# Patient Record
Sex: Female | Born: 1965 | Race: White | Hispanic: No | Marital: Married | Smoking: Former smoker
Health system: Southern US, Community
[De-identification: ages and names within clinical notes are randomized; demographics above are authoritative.]

## PROBLEM LIST (undated history)

## (undated) DIAGNOSIS — F988 Other specified behavioral and emotional disorders with onset usually occurring in childhood and adolescence: Secondary | ICD-10-CM

## (undated) DIAGNOSIS — F32A Depression, unspecified: Secondary | ICD-10-CM

## (undated) DIAGNOSIS — G43909 Migraine, unspecified, not intractable, without status migrainosus: Secondary | ICD-10-CM

## (undated) DIAGNOSIS — F329 Major depressive disorder, single episode, unspecified: Secondary | ICD-10-CM

## (undated) DIAGNOSIS — J45909 Unspecified asthma, uncomplicated: Secondary | ICD-10-CM

## (undated) DIAGNOSIS — K219 Gastro-esophageal reflux disease without esophagitis: Secondary | ICD-10-CM

## (undated) HISTORY — PX: SHOULDER SURGERY: SHX246

## (undated) HISTORY — PX: CHOLECYSTECTOMY: SHX55

## (undated) HISTORY — PX: TONSILLECTOMY: SUR1361

---

## 2011-03-23 ENCOUNTER — Ambulatory Visit: Payer: Self-pay

## 2012-03-29 ENCOUNTER — Ambulatory Visit: Payer: Self-pay | Admitting: Emergency Medicine

## 2012-03-29 LAB — RAPID STREP-A WITH REFLX: Micro Text Report: NEGATIVE

## 2012-03-29 LAB — RAPID INFLUENZA A&B ANTIGENS

## 2012-03-31 LAB — BETA STREP CULTURE(ARMC)

## 2015-07-13 ENCOUNTER — Ambulatory Visit
Admission: EM | Admit: 2015-07-13 | Discharge: 2015-07-13 | Disposition: A | Discharge status: Home / Self Care | Payer: Federal, State, Local not specified - PPO | Attending: Family Medicine | Admitting: Family Medicine

## 2015-07-13 DIAGNOSIS — R0981 Nasal congestion: Secondary | ICD-10-CM

## 2015-07-13 DIAGNOSIS — J069 Acute upper respiratory infection, unspecified: Secondary | ICD-10-CM

## 2015-07-13 HISTORY — DX: Gastro-esophageal reflux disease without esophagitis: K21.9

## 2015-07-13 HISTORY — DX: Depression, unspecified: F32.A

## 2015-07-13 HISTORY — DX: Other specified behavioral and emotional disorders with onset usually occurring in childhood and adolescence: F98.8

## 2015-07-13 HISTORY — DX: Migraine, unspecified, not intractable, without status migrainosus: G43.909

## 2015-07-13 HISTORY — DX: Major depressive disorder, single episode, unspecified: F32.9

## 2015-07-13 MED ORDER — AZITHROMYCIN 250 MG PO TABS: ORAL_TABLET | ORAL | Status: DC

## 2015-07-13 NOTE — ED Notes (Signed)
States has had sinus congestion x 2 weeks, and yesterday started with non productive cough. Denies fever

## 2015-07-13 NOTE — ED Provider Notes (Signed)
Patient presents today with symptoms of nasal congestion for process like 2 weeks, and mild productive cough for about a week. Patient denies any fever, chest pain, shortness of breath, severe headache, nausea, vomiting, diarrhea. Patient has tried some over-the-counter medications without any significant relief. She does have a history of asthma. She is a former smoker. She has not taken any fever lowering medication today.  ROS: Negative except mentioned above.  Vitals as per Epic GENERAL: NAD HEENT: mild pharyngeal erythema, no exudate, no erythema of TMs, no significant maxillary sinus tenderness, no cervical LAD RESP: CTA B CARD: RRR  A/P: URI, Sinusitis- Z-pk, Delysm prn, Claritin prn, Tylenol/Motrin prn, Albuterol Inhaler when necessary, rest, hydration, seek medical attention if symptoms persist or worsen as discussed.    Jolene ProvostKirtida Ayelet Gruenewald, MD 07/13/15 [phone]

## 2015-09-06 ENCOUNTER — Encounter: Payer: Self-pay | Admitting: Emergency Medicine

## 2015-09-06 ENCOUNTER — Emergency Department
Admission: EM | Admit: 2015-09-06 | Discharge: 2015-09-06 | Disposition: A | Discharge status: Home / Self Care | Payer: Worker's Compensation | Attending: Emergency Medicine | Admitting: Emergency Medicine

## 2015-09-06 ENCOUNTER — Emergency Department: Payer: Worker's Compensation

## 2015-09-06 DIAGNOSIS — Z79899 Other long term (current) drug therapy: Secondary | ICD-10-CM | POA: Diagnosis not present

## 2015-09-06 DIAGNOSIS — Z880 Allergy status to penicillin: Secondary | ICD-10-CM | POA: Insufficient documentation

## 2015-09-06 DIAGNOSIS — J45909 Unspecified asthma, uncomplicated: Secondary | ICD-10-CM | POA: Diagnosis present

## 2015-09-06 DIAGNOSIS — J45901 Unspecified asthma with (acute) exacerbation: Secondary | ICD-10-CM | POA: Diagnosis not present

## 2015-09-06 DIAGNOSIS — Z87891 Personal history of nicotine dependence: Secondary | ICD-10-CM | POA: Insufficient documentation

## 2015-09-06 HISTORY — DX: Unspecified asthma, uncomplicated: J45.909

## 2015-09-06 MED ORDER — PREDNISONE 10 MG PO TABS: ORAL_TABLET | ORAL | Status: DC

## 2015-09-06 MED ORDER — PREDNISONE 20 MG PO TABS
30.0000 mg | ORAL_TABLET | Freq: Once | ORAL | Status: AC
  Administered 2015-09-06: 30 mg via ORAL
  Filled 2015-09-06: qty 1

## 2015-09-06 NOTE — Discharge Instructions (Signed)
Asthma, Adult Asthma is a condition of the lungs in which the airways tighten and narrow. Asthma can make it hard to breathe. Asthma cannot be cured, but medicine and lifestyle changes can help control it. Asthma may be started (triggered) by:  Animal skin flakes (dander).  Dust.  Cockroaches.  Pollen.  Mold.  Smoke.  Cleaning products.  Hair sprays or aerosol sprays.  Paint fumes or strong smells.  Cold air, weather changes, and winds.  Crying or laughing hard.  Stress.  Certain medicines or drugs.  Foods, such as dried fruit, potato chips, and sparkling grape juice.  Infections or conditions (colds, flu).  Exercise.  Certain medical conditions or diseases.  Exercise or tiring activities. HOME CARE   Take medicine as told by your doctor.  Use a peak flow meter as told by your doctor. A peak flow meter is a tool that measures how well the lungs are working.  Record and keep track of the peak flow meter's readings.  Understand and use the asthma action plan. An asthma action plan is a written plan for taking care of your asthma and treating your attacks.  To help prevent asthma attacks:  Do not smoke. Stay away from secondhand smoke.  Change your heating and air conditioning filter often.  Limit your use of fireplaces and wood stoves.  Get rid of pests (such as roaches and mice) and their droppings.  Throw away plants if you see mold on them.  Clean your floors. Dust regularly. Use cleaning products that do not smell.  Have someone vacuum when you are not home. Use a vacuum cleaner with a HEPA filter if possible.  Replace carpet with wood, tile, or vinyl flooring. Carpet can trap animal skin flakes and dust.  Use allergy-proof pillows, mattress covers, and box spring covers.  Wash bed sheets and blankets every week in hot water and dry them in a dryer.  Use blankets that are made of polyester or cotton.  Clean bathrooms and kitchens with bleach.  If possible, have someone repaint the walls in these rooms with mold-resistant paint. Keep out of the rooms that are being cleaned and painted.  Wash hands often. GET HELP IF:  You have make a whistling sound when breaking (wheeze), have shortness of breath, or have a cough even if taking medicine to prevent attacks.  The colored mucus you cough up (sputum) is thicker than usual.  The colored mucus you cough up changes from clear or white to yellow, green, gray, or bloody.  You have problems from the medicine you are taking such as:  A rash.  Itching.  Swelling.  Trouble breathing.  You need reliever medicines more than 2-3 times a week.  Your peak flow measurement is still at 50-79% of your personal best after following the action plan for 1 hour.  You have a fever. GET HELP RIGHT AWAY IF:   You seem to be worse and are not responding to medicine during an asthma attack.  You are short of breath even at rest.  You get short of breath when doing very little activity.  You have trouble eating, drinking, or talking.  You have chest pain.  You have a fast heartbeat.  Your lips or fingernails start to turn blue.  You are light-headed, dizzy, or faint.  Your peak flow is less than 50% of your personal best.   This information is not intended to replace advice given to you by your health care provider. Make sure  you discuss any questions you have with your health care provider.   Document Released: 12/24/2007 Document Revised: 03/28/2015 Document Reviewed: 02/03/2013 Elsevier Interactive Patient Education [employer]    Follow-up with your primary care doctor. Continue using your  inhaler as needed. Also take prednisone daily. You have had your first dose in the emergency room and will not need to take anymore until tomorrow. You  may return to work tomorrow.

## 2015-09-06 NOTE — ED Notes (Signed)
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 Pt in NAD at this time.

## 2015-09-06 NOTE — ED Provider Notes (Signed)
[facility] Emergency Department Provider Note  ____________________________________________  Time seen: Approximately 8:39 AM  I have reviewed the triage vital signs and the nursing notes.   HISTORY  Chief Complaint Asthma   HPI Brittany Ferrell is a 50 y.o. 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 Patient has a history of asthma and is very sensitive to sense. She states that she screamed for someone to admit to spraying the perfume which no one did.She states at that time she did not immediately use her inhaler which she has with her but continued to drive to school and then after letting children off the bus she began using her inhaler. She states she used it once. She is now in the emergency room for this event. Rates her discomfort is an 8 out of 10.   Past Medical History  Diagnosis Date  . ADD (attention deficit disorder)   . Depressed   . Migraines   . GERD (gastroesophageal reflux disease)   . Asthma     There are no active problems to display for this patient.   Past Surgical History  Procedure Laterality Date  . Cholecystectomy    . Shoulder surgery    . Tonsillectomy    . Cesarean section      1998    Current Outpatient Rx  Name  Route  Sig  Dispense  Refill  . albuterol (PROVENTIL HFA;VENTOLIN HFA) 108 (90 Base) MCG/ACT inhaler   Inhalation   Inhale 2 puffs into the lungs every 6 (six) hours as needed for wheezing or shortness of breath.         Marland Kitchen buPROPion (WELLBUTRIN SR) 150 MG 12 hr tablet   Oral   Take 150 mg by mouth 2 (two) times daily.         Marland Kitchen lisdexamfetamine (VYVANSE) 20 MG capsule   Oral   Take 20 mg by mouth daily.         . pantoprazole (PROTONIX) 40 MG tablet   Oral   Take 40 mg by mouth daily.         . promethazine (PHENERGAN) 25 MG tablet   Oral   Take 25 mg by mouth every 6 (six) hours as needed for nausea or vomiting.          . topiramate (TOPAMAX) 50 MG tablet   Oral   Take 50 mg by mouth 2 (two) times daily.         . valACYclovir (VALTREX) 500 MG tablet   Oral   Take 500 mg by mouth 2 (two) times daily.         Marland Kitchen azithromycin (ZITHROMAX Z-PAK) 250 MG tablet      Use as directed for 5 days.   6 each   0   . Meclizine HCl (ANTIVERT PO)   Oral   Take by mouth.         . predniSONE (DELTASONE) 10 MG tablet      Take 3 tablets once a day for 2 days starting Friday   9 tablet   0   . ZOLMitriptan (ZOMIG) 2.5 MG tablet   Oral   Take 2.5 mg by mouth once. May repeat in 2 hours if headache persists or recurs.           Allergies Coconut fragrance; Oxycodone; Penicillins; and Ciprofloxacin  Family History  Problem Relation Age of Onset  . Cancer  Mother   . Heart failure Father     Social History Social History  Substance Use Topics  . Smoking status: Former Games developer  . Smokeless tobacco: None  . Alcohol Use: Yes     Comment: rarely    Review of Systems Constitutional: No fever/chills Eyes: No visual changes. ENT: No sore throat. Cardiovascular: Denies chest pain. Respiratory: Positive shortness of breath, positive wheezing. Gastrointestinal: No nausea, no vomiting.  Musculoskeletal: Negative for back pain. Skin: Negative for rash. Neurological: Negative for headaches, focal weakness or numbness.  10-point ROS otherwise negative.  ____________________________________________   PHYSICAL EXAM:  VITAL SIGNS: ED Triage Vitals  Enc Vitals Group     BP 09/06/15 0801 156/93 mmHg     Pulse Rate 09/06/15 0801 81     Resp 09/06/15 0801 18     Temp 09/06/15 0801 97.9 F (36.6 C)     Temp Source 09/06/15 0801 Oral     SpO2 09/06/15 0801 100 %     Weight 09/06/15 0801 185 lb (83.915 kg)     Height 09/06/15 0801  (1.575 m)     Head Cir --      Peak Flow --      Pain Score 09/06/15 0802 8     Pain Loc --      Pain Edu? --      Excl. in GC? --      Constitutional: Alert and oriented. Well appearing and in no acute distress. Eyes: Conjunctivae are normal. PERRL. EOMI. Head: Atraumatic. Nose: No congestion/rhinnorhea.   EACs and TMs are clear bilaterally. Mouth/Throat: Mucous membranes are moist.  Oropharynx non-erythematous. Neck: No stridor.   Hematological/Lymphatic/Immunilogical: No cervical lymphadenopathy. Cardiovascular: Normal rate, regular rhythm. Grossly normal heart sounds.  Good peripheral circulation. Respiratory: Normal respiratory effort.  No retractions. Lungs mild expiratory wheezes heard throughout. Patient is not in any respiratory distress and is able to talk in complete sentences without any difficulty. Gastrointestinal: Soft and nontender. No distention. Musculoskeletal: No lower extremity tenderness nor edema.  No joint effusions. Neurologic:  Normal speech and language. No gross focal neurologic deficits are appreciated. No gait instability. Skin:  Skin is warm, dry and intact. No rash noted. Psychiatric: Mood and affect are normal. Speech and behavior are normal.  ____________________________________________   LABS (all labs ordered are listed, but only abnormal results are displayed)  Labs Reviewed - No data to display  RADIOLOGY  Chest x-ray is negative per radiologist ____________________________________________   PROCEDURES  Procedure(s) performed: None  Critical Care performed: No  ____________________________________________   INITIAL IMPRESSION / ASSESSMENT AND PLAN / ED COURSE  Pertinent labs & imaging results that were available during my care of the patient were reviewed by me and considered in my medical decision making (see chart for details).  Patient was given prednisone while in the emergency room and also a nebulizer treatment which seemed to improve her symptoms. She was discharged with prednisone 30 mg for the next 2 days and she is to continue using her albuterol inhaler.  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 ____________________________________________   FINAL CLINICAL IMPRESSION(S) / ED DIAGNOSES  Final diagnoses:  Asthma exacerbation      Tommi Rumps, PA-C 09/06/15 1300  Emily Filbert, MD 09/06/15 [phone]

## 2017-04-22 IMAGING — CR DG CHEST 2V
1 series · 2 of 2 positions shown · non-contrast
Comparison: None.

CLINICAL DATA: Shortness of breath and central chest pain today
after inhaling perfume. Initial encounter.

EXAM:
CHEST  2 VIEW

[Series 1: dg chest 2 view · 0.14mm/px · 2 of 2 slices shown]
[im 1/2]
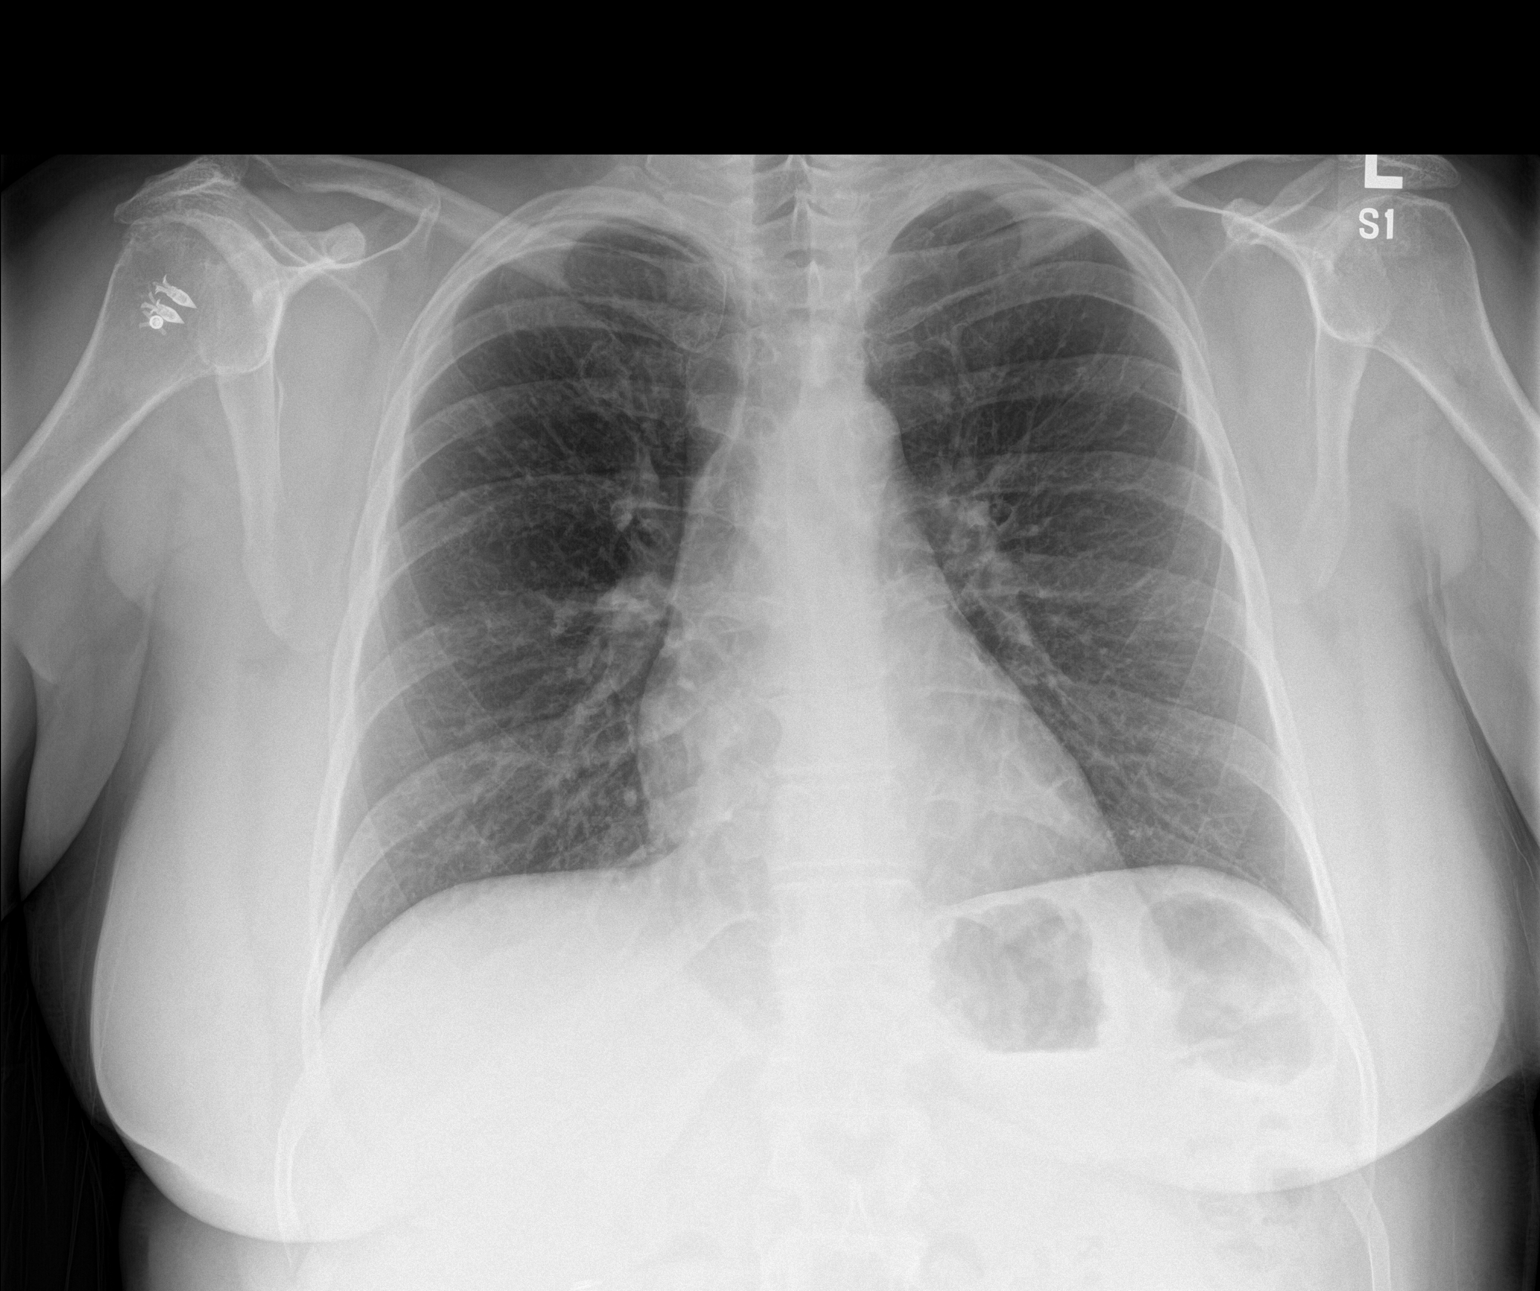
[im 2/2]
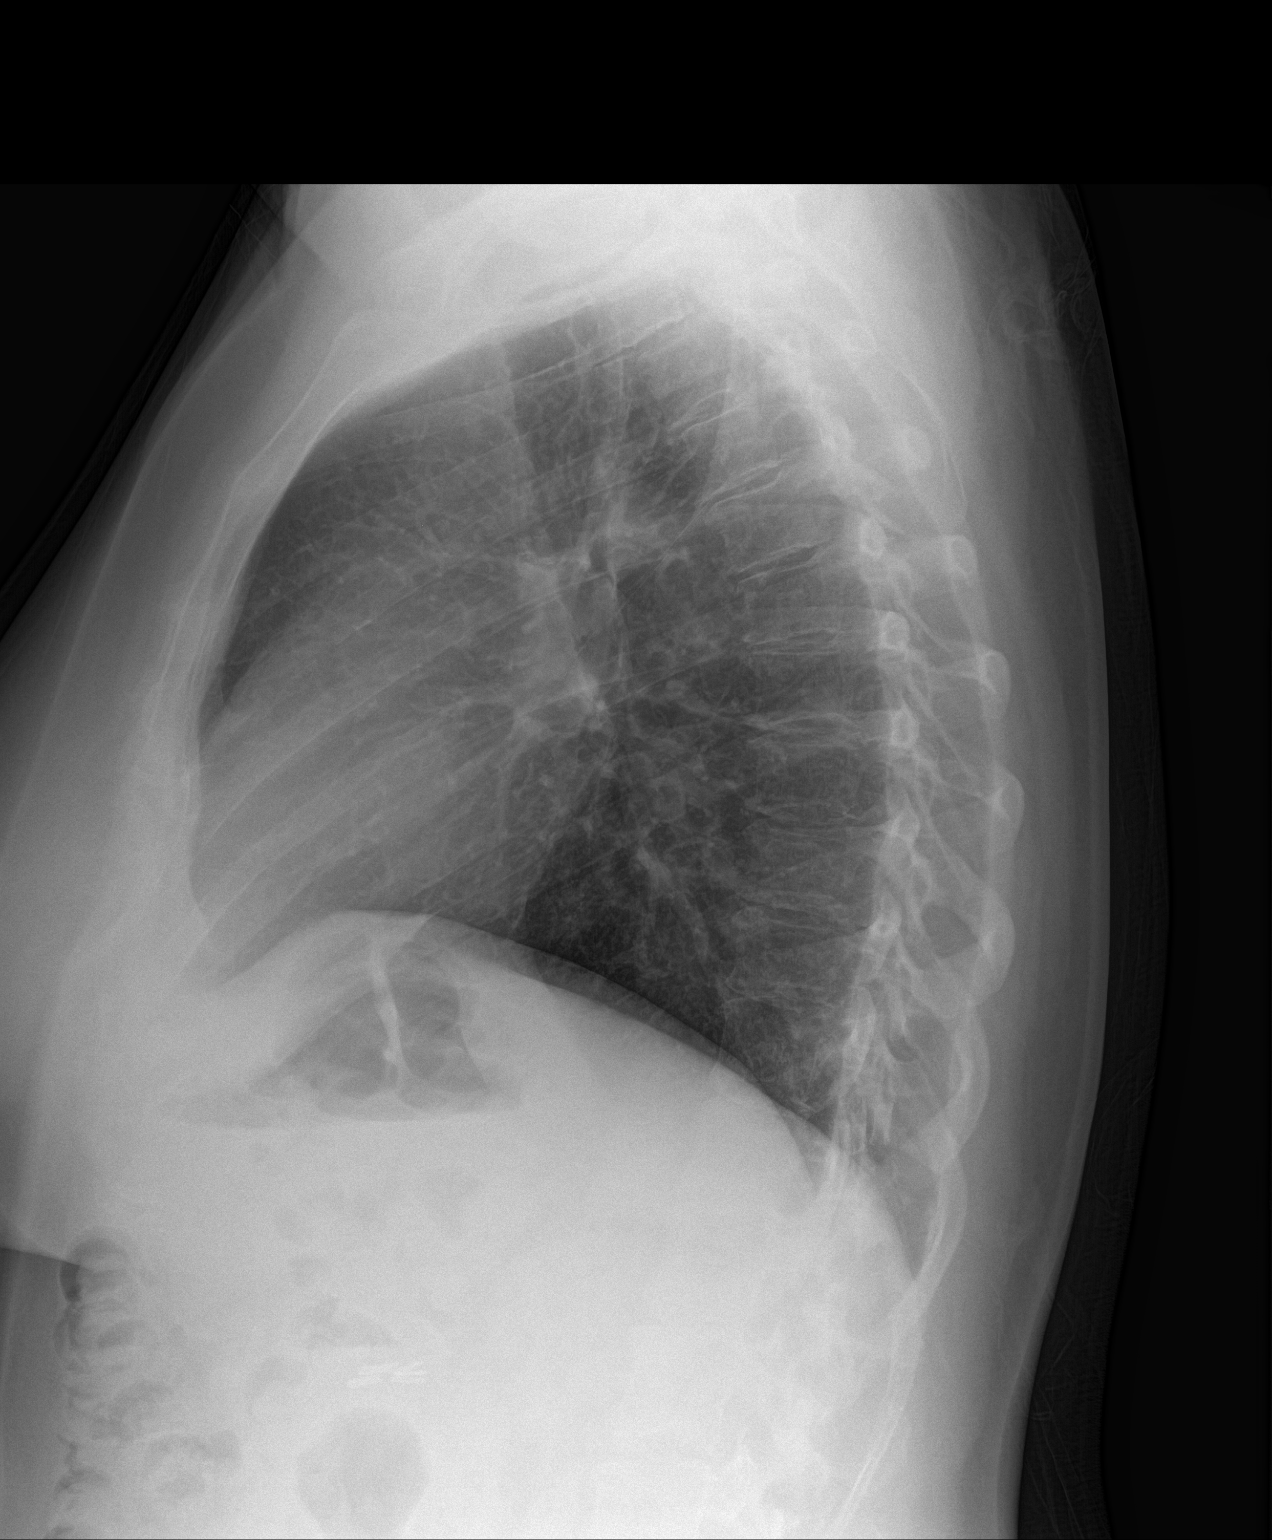

[2 of 2 positions shown; findings below may reference images not displayed]

FINDINGS: The lungs are clear. Heart size is normal. There is no pneumothorax
or pleural effusion. No focal bony abnormality. Postoperative change
right shoulder noted.
IMPRESSION: Negative chest.

## 2019-08-28 ENCOUNTER — Other Ambulatory Visit: Payer: Self-pay

## 2019-08-28 ENCOUNTER — Encounter: Payer: Self-pay | Admitting: Emergency Medicine

## 2019-08-28 ENCOUNTER — Ambulatory Visit
Admission: EM | Admit: 2019-08-28 | Discharge: 2019-08-28 | Disposition: A | Discharge status: Home / Self Care | Payer: Federal, State, Local not specified - PPO | Attending: Emergency Medicine | Admitting: Emergency Medicine

## 2019-08-28 DIAGNOSIS — J0100 Acute maxillary sinusitis, unspecified: Secondary | ICD-10-CM

## 2019-08-28 DIAGNOSIS — R519 Headache, unspecified: Secondary | ICD-10-CM

## 2019-08-28 DIAGNOSIS — R0981 Nasal congestion: Secondary | ICD-10-CM

## 2019-08-28 MED ORDER — DOXYCYCLINE HYCLATE 100 MG PO CAPS: 100.0000 mg | ORAL_CAPSULE | Freq: Two times a day (BID) | ORAL | 0 refills | Status: AC

## 2019-08-28 NOTE — ED Provider Notes (Addendum)
[facility]    CSN: [id number] Arrival date & time: 08/28/19  1326      History   Chief Complaint Chief Complaint  Patient presents with  . Headache  . Nasal Congestion    HPI Brittany Ferrell is a 54 y.o. female.   HPI  54 year old female presents with a 1 month history of headache and nasal congestion.  Had nasal discharge that has been very cloudy and yellow.  She denies any fevers.  He has had mild coughing possibly from postnasal drip.  She states that she has had Covid testing in the past and refuses any Covid testing today.        Past Medical History:  Diagnosis Date  . ADD (attention deficit disorder)   . Asthma   . Depressed   . GERD (gastroesophageal reflux disease)   . Migraines     There are no problems to display for this patient.   Past Surgical History:  Procedure Laterality Date  . CESAREAN SECTION     1998  . CHOLECYSTECTOMY    . SHOULDER SURGERY    . TONSILLECTOMY      OB History   No obstetric history on file.      Home Medications    Prior to Admission medications   Medication Sig Start Date End Date Taking? Authorizing Provider  albuterol (PROVENTIL HFA;VENTOLIN HFA) 108 (90 Base) MCG/ACT inhaler Inhale 2 puffs into the lungs every 6 (six) hours as needed for wheezing or shortness of breath.   Yes [provider]  buPROPion (WELLBUTRIN SR) 150 MG 12 hr tablet Take 150 mg by mouth 2 (two) times daily.   Yes [provider]  lisdexamfetamine (VYVANSE) 20 MG capsule Take 20 mg by mouth daily.   Yes [provider]  Meclizine HCl (ANTIVERT PO) Take by mouth.   Yes [provider]  pantoprazole (PROTONIX) 40 MG tablet Take 40 mg by mouth daily.   Yes [provider]  promethazine (PHENERGAN) 25 MG tablet Take 25 mg by mouth every 6 (six) hours as needed for nausea or vomiting.   Yes [provider]  topiramate (TOPAMAX) 50 MG tablet Take 50 mg by mouth 2 (two) times  daily.   Yes [provider]  valACYclovir (VALTREX) 500 MG tablet Take 500 mg by mouth 2 (two) times daily.   Yes [provider]  ZOLMitriptan (ZOMIG) 2.5 MG tablet Take 2.5 mg by mouth once. May repeat in 2 hours if headache persists or recurs.   Yes [provider]  doxycycline (VIBRAMYCIN) 100 MG capsule Take 1 capsule (100 mg total) by mouth 2 (two) times daily. 08/28/19   Lorin Picket, PA-C    Family History Family History  Problem Relation Age of Onset  . Cancer Mother   . Heart failure Father     Social History Social History   Tobacco Use  . Smoking status: Former Research scientist (life sciences)  . Smokeless tobacco: Never Used  Substance Use Topics  . Alcohol use: Yes    Comment: rarely  . Drug use: Not on file     Allergies   Coconut fragrance, Oxycodone, Penicillins, and Ciprofloxacin   Review of Systems Review of Systems  Constitutional: Negative for activity change, appetite change, chills, diaphoresis, fatigue and fever.  HENT: Positive for congestion, postnasal drip, rhinorrhea and sinus pressure.   Respiratory: Positive for cough.   All other systems reviewed and are negative.    Physical Exam Triage Vital Signs  ED Triage Vitals  Enc Vitals Group     BP 08/28/19 1345 129/90     Pulse Rate 08/28/19 1345 94     Resp 08/28/19 1345 18     Temp 08/28/19 1345 98.6 F (37 C)     Temp Source 08/28/19 1345 Oral     SpO2 08/28/19 1345 97 %     Weight 08/28/19 1341 203 lb (92.1 kg)     Height 08/28/19 1341 5\' 2"  (1.575 m)     Head Circumference --      Peak Flow --      Pain Score 08/28/19 1340 4     Pain Loc --      Pain Edu? --      Excl. in GC? --    No data found.  Updated Vital Signs BP 129/90 (BP Location: Left Arm)   Pulse 94   Temp 98.6 F (37 C) (Oral)   Resp 18   Ht 5\' 2"  (1.575 m)   Wt 203 lb (92.1 kg)   SpO2 97%   BMI 37.13 kg/m   Visual Acuity Right Eye Distance:   Left Eye Distance:   Bilateral Distance:    Right  Eye Near:   Left Eye Near:    Bilateral Near:     Physical Exam Vitals and nursing note reviewed.  Constitutional:      General: She is not in acute distress.    Appearance: She is well-developed. She is obese. She is not ill-appearing or toxic-appearing.  HENT:     Head: Normocephalic and atraumatic.     Comments: There is tenderness over the frontal and more so over the maxillary sinuses bilaterally.    Mouth/Throat:     Mouth: Mucous membranes are moist.     Pharynx: Oropharynx is clear.  Cardiovascular:     Rate and Rhythm: Normal rate and regular rhythm.     Heart sounds: Normal heart sounds.  Pulmonary:     Effort: Pulmonary effort is normal.     Breath sounds: Normal breath sounds.  Musculoskeletal:        General: Normal range of motion.     Cervical back: Normal range of motion and neck supple. No rigidity.  Lymphadenopathy:     Cervical: No cervical adenopathy.  Skin:    General: Skin is warm and dry.  Neurological:     Mental Status: She is alert and oriented to person, place, and time.  Psychiatric:        Mood and Affect: Mood normal.        Speech: Speech normal.        Behavior: Behavior normal.      UC Treatments / Results  Labs (all labs ordered are listed, but only abnormal results are displayed) Labs Reviewed - No data to display  EKG   Radiology No results found.  Procedures Procedures (including critical care time)  Medications Ordered in UC Medications - No data to display  Initial Impression / Assessment and Plan / UC Course  I have reviewed the triage vital signs and the nursing notes.  Pertinent labs & imaging results that were available during my care of the patient were reviewed by me and considered in my medical decision making (see chart for details).   54 year old female presents with a 1 month history of nasal congestion and headaches along with sinus pressure and pain.  She has had no fevers.  She has started having a mild  cough.  She has refused to Covid testing today.  Physical exam today supports probable sinus infection especially with the time that she has been having her symptoms.  Is allergic to penicillin-so we will start her on doxycycline .  She will also continue using her Flonase.  If she is not improving she should follow-up with her primary care physician Final Clinical Impressions(s) / UC Diagnoses   Final diagnoses:  Acute maxillary sinusitis, recurrence not specified     Discharge Instructions     Be sure to take all 10 days of your medications.  Recommend using Flonase nasal spray on a daily basis.  If you are not improving recommend following up with your primary care physician   ED Prescriptions    Medication Sig Dispense Auth. Provider   doxycycline (VIBRAMYCIN) 100 MG capsule Take 1 capsule (100 mg total) by mouth 2 (two) times daily. 20 capsule Lutricia Feil, PA-C     PDMP not reviewed this encounter.   Lutricia Feil, PA-C 08/28/19 1524    Lutricia Feil, PA-C 08/28/19 1525

## 2019-08-28 NOTE — ED Triage Notes (Signed)
Pt c/o headache and nasal congestion. She states it started a month ago. Denies fever. States she knows it is a sinus infection and does not want to be swabbed for covid.

## 2019-08-28 NOTE — Discharge Instructions (Signed)
Be sure to take all 10 days of your medications.  Recommend using Flonase nasal spray on a daily basis.  If you are not improving recommend following up with your primary care physician

## 2019-09-25 ENCOUNTER — Ambulatory Visit: Payer: Federal, State, Local not specified - PPO
